# Patient Record
Sex: Male | Born: 1985 | Race: White | Hispanic: No | Marital: Married | Smoking: Former smoker
Health system: Southern US, Community
[De-identification: ages and names within clinical notes are randomized; demographics above are authoritative.]

## PROBLEM LIST (undated history)

## (undated) ENCOUNTER — Emergency Department (HOSPITAL_COMMUNITY): Admission: EM | Payer: Self-pay | Source: Home / Self Care

## (undated) DIAGNOSIS — R040 Epistaxis: Secondary | ICD-10-CM

## (undated) DIAGNOSIS — R51 Headache: Secondary | ICD-10-CM

## (undated) DIAGNOSIS — G8929 Other chronic pain: Secondary | ICD-10-CM

## (undated) DIAGNOSIS — R519 Headache, unspecified: Secondary | ICD-10-CM

## (undated) HISTORY — PX: FINGER SURGERY: SHX640

---
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
## 2006-04-14 ENCOUNTER — Emergency Department (HOSPITAL_COMMUNITY): Admission: EM | Admit: 2006-04-14 | Discharge: 2006-04-14 | Payer: Self-pay | Admitting: *Deleted

## 2006-04-17 ENCOUNTER — Emergency Department (HOSPITAL_COMMUNITY): Admission: EM | Admit: 2006-04-17 | Discharge: 2006-04-17 | Payer: Self-pay | Admitting: Emergency Medicine

## 2006-05-08 ENCOUNTER — Emergency Department (HOSPITAL_COMMUNITY): Admission: EM | Admit: 2006-05-08 | Discharge: 2006-05-09 | Payer: Self-pay | Admitting: *Deleted

## 2006-06-02 ENCOUNTER — Emergency Department (HOSPITAL_COMMUNITY): Admission: EM | Admit: 2006-06-02 | Discharge: 2006-06-02 | Payer: Self-pay | Admitting: Emergency Medicine

## 2006-12-08 ENCOUNTER — Emergency Department (HOSPITAL_COMMUNITY): Admission: EM | Admit: 2006-12-08 | Discharge: 2006-12-08 | Payer: Self-pay | Admitting: Emergency Medicine

## 2007-09-02 ENCOUNTER — Emergency Department (HOSPITAL_COMMUNITY): Admission: EM | Admit: 2007-09-02 | Discharge: 2007-09-02 | Payer: Self-pay | Admitting: Emergency Medicine

## 2012-04-13 ENCOUNTER — Emergency Department (HOSPITAL_COMMUNITY)
Admission: EM | Admit: 2012-04-13 | Discharge: 2012-04-14 | Disposition: A | Discharge status: Home / Self Care | Payer: Self-pay | Attending: Emergency Medicine | Admitting: Emergency Medicine

## 2012-04-13 ENCOUNTER — Encounter (HOSPITAL_COMMUNITY): Payer: Self-pay | Admitting: *Deleted

## 2012-04-13 DIAGNOSIS — R5381 Other malaise: Secondary | ICD-10-CM | POA: Insufficient documentation

## 2012-04-13 DIAGNOSIS — R51 Headache: Secondary | ICD-10-CM | POA: Insufficient documentation

## 2012-04-13 DIAGNOSIS — Z87891 Personal history of nicotine dependence: Secondary | ICD-10-CM | POA: Insufficient documentation

## 2012-04-13 DIAGNOSIS — R509 Fever, unspecified: Secondary | ICD-10-CM | POA: Insufficient documentation

## 2012-04-13 DIAGNOSIS — R110 Nausea: Secondary | ICD-10-CM | POA: Insufficient documentation

## 2012-04-13 HISTORY — DX: Headache: R51

## 2012-04-13 HISTORY — DX: Headache, unspecified: R51.9

## 2012-04-13 HISTORY — DX: Other chronic pain: G89.29

## 2012-04-13 LAB — CBC WITH DIFFERENTIAL/PLATELET
Basophils Absolute: 0 10*3/uL (ref 0.0–0.1)
Basophils Relative: 0 % (ref 0–1)
Eosinophils Absolute: 0.2 10*3/uL (ref 0.0–0.7)
Eosinophils Relative: 2 % (ref 0–5)
HCT: 42.6 % (ref 39.0–52.0)
Hemoglobin: 14.6 g/dL (ref 13.0–17.0)
MCH: 27.5 pg (ref 26.0–34.0)
MCHC: 34.3 g/dL (ref 30.0–36.0)
Monocytes Absolute: 1.1 10*3/uL — ABNORMAL HIGH (ref 0.1–1.0)
Monocytes Relative: 12 % (ref 3–12)
RDW: 13.2 % (ref 11.5–15.5)

## 2012-04-13 MED ORDER — ACETAMINOPHEN 325 MG PO TABS
1000.0000 mg | ORAL_TABLET | Freq: Once | ORAL | Status: AC
  Administered 2012-04-13: 975 mg via ORAL
  Filled 2012-04-13: qty 3

## 2012-04-13 MED ORDER — METOCLOPRAMIDE HCL 5 MG/ML IJ SOLN
10.0000 mg | Freq: Once | INTRAMUSCULAR | Status: AC
  Administered 2012-04-13: 10 mg via INTRAMUSCULAR
  Filled 2012-04-13: qty 2

## 2012-04-13 NOTE — ED Provider Notes (Signed)
History     CSN: [id number]  Arrival date & time 04/13/12  2019   None     Chief Complaint  Patient presents with  . Fever  . Headache    (Consider location/radiation/quality/duration/timing/severity/associated sxs/prior treatment) HPI History provided by pt.   Pt has had the "flu" and "migraine" for the past 4 days.  Denies nasal congestion, rhinorrhea, cough and sore throat.  Has had a fever for the past 2 days, max temp 102, as well as right frontal headache w/ associated blurred vision and photophobia.  Has had generalized weakness and body aches.  Has also had difficulty completely emptying bladder when urinating, but otherwise no GU sx.  Has had nausea and 2 episodes of vomiting, but denies abd pain and diarrhea.  No rash and no known tick exposure. No prior h/o migraines.  Denies head trauma.    Past Medical History  Diagnosis Date  . Chronic headaches     History reviewed. No pertinent past surgical history.  No family history on file.  History  Substance Use Topics  . Smoking status: Former Games developer  . Smokeless tobacco: Not on file  . Alcohol Use: Yes     occasional      Review of Systems  All other systems reviewed and are negative.    Allergies  Review of patient's allergies indicates no known allergies.  Home Medications   Current Outpatient Rx  Name Route Sig Dispense Refill  . IBUPROFEN 200 MG PO TABS Oral Take 200 mg by mouth every 6 (six) hours as needed. For pain    . NAPROXEN SODIUM 220 MG PO TABS Oral Take 220 mg by mouth 2 (two) times daily after a meal. For pain      BP 134/69  Pulse 97  Temp 100.7 F (38.2 C) (Oral)  Resp 18  SpO2 100%  Physical Exam  Nursing note and vitals reviewed. Constitutional: He is oriented to person, place, and time. He appears well-developed and well-nourished. No distress.  HENT:  Head: Normocephalic and atraumatic.  Mouth/Throat: Oropharynx is clear and moist. No oropharyngeal exudate.       No sinus  tenderness.    Eyes:       Normal appearance  Neck: Normal range of motion. Neck supple.       No meningeal signs  Cardiovascular: Normal rate, regular rhythm and intact distal pulses.   Pulmonary/Chest: Effort normal and breath sounds normal.  Abdominal: Soft. Bowel sounds are normal. He exhibits no distension. There is no tenderness.  Genitourinary:       No CVA ttp  Musculoskeletal: Normal range of motion.  Lymphadenopathy:    He has no cervical adenopathy.  Neurological: He is alert and oriented to person, place, and time. No sensory deficit. Coordination normal.       CN 3-12 intact.  No nystagmus. 5/5 and equal upper and lower extremity strength.  No past pointing.     Skin: Skin is warm and dry. No rash noted.  Psychiatric: He has a normal mood and affect. His behavior is normal.    ED Course  Procedures (including critical care time)  Labs Reviewed  CBC WITH DIFFERENTIAL - Abnormal; Notable for the following:    Monocytes Absolute 1.1 (*)     All other components within normal limits  URINALYSIS, ROUTINE W REFLEX MICROSCOPIC   No results found.   1. Fever       MDM  Healthy 25yo M presents w/ fever, generalized weakness,  body aches, right frontal headache and N/V.  He is febrile on exam but well-appearing and no focal neuro deficits or meningeal signs.  Doubt meningitis.  Low suspicion for strep pharyngitis based on centor criteria.  Low suspicion for pneumonia based on vital signs and lack of pulmonary sx.  Has not had abdominal pain or diarrhea to suggest gastroenteritis.  U/A neg for UTI.  No known tick exposure or objective findings concerning for RMSF.  Fever resolved w/ IM reglan and fever improved w/ acetaminophen.  Pt d/c'd home w/ phenergan to treat nausea and hopefully headache.  Strict return precautions discussed.        Otilio Miu, [place] 04/14/12 [phone]

## 2012-04-13 NOTE — ED Notes (Signed)
Pt in c/o fever, generalized body aches, and headache x3 days, states he has noted decreased appetite.

## 2012-04-13 NOTE — ED Notes (Signed)
Pt to ED c/o fever, nausea, migraines and blurred vision associated with migraine since Mon.  States has been taking ibuprofen/tylenol with little relief.

## 2012-04-13 NOTE — ED Notes (Signed)
i-Stat CHEM8+ Results:  Na 138 K 3.6 Cl 102 iCa 1.12 TCO2 24  Glu 157 BUN 14 Crea 1.1 Hct 46 Hb* 15.6  AnGap 17

## 2012-04-14 LAB — URINALYSIS, ROUTINE W REFLEX MICROSCOPIC
Bilirubin Urine: NEGATIVE
Hgb urine dipstick: NEGATIVE
Ketones, ur: NEGATIVE mg/dL
Nitrite: NEGATIVE
Protein, ur: NEGATIVE mg/dL
Urobilinogen, UA: 1 mg/dL (ref 0.0–1.0)

## 2012-04-14 LAB — POCT I-STAT, CHEM 8
Calcium, Ion: 1.12 mmol/L (ref 1.12–1.23)
HCT: 46 % (ref 39.0–52.0)
Hemoglobin: 15.6 g/dL (ref 13.0–17.0)
Sodium: 138 mEq/L (ref 135–145)
TCO2: 24 mmol/L (ref 0–100)

## 2012-04-14 MED ORDER — PROMETHAZINE HCL 25 MG PO TABS: 25.0000 mg | ORAL_TABLET | Freq: Four times a day (QID) | ORAL | Status: DC | PRN

## 2012-05-05 NOTE — ED Provider Notes (Signed)
Medical screening examination/treatment/procedure(s) were performed by non-physician practitioner and as supervising physician I was immediately available for consultation/collaboration.   Loren Racer, MD 05/05/12 1616

## 2012-05-09 NOTE — ED Provider Notes (Signed)
Medical screening examination/treatment/procedure(s) were performed by non-physician practitioner and as supervising physician I was immediately available for consultation/collaboration.  Jaidyn Kuhl Lytle Michaels, MD 05/09/12 [phone]

## 2012-10-04 ENCOUNTER — Encounter (HOSPITAL_COMMUNITY): Payer: Self-pay

## 2012-10-04 ENCOUNTER — Emergency Department (HOSPITAL_COMMUNITY)
Admission: EM | Admit: 2012-10-04 | Discharge: 2012-10-04 | Disposition: A | Discharge status: Home / Self Care | Payer: Self-pay | Attending: Emergency Medicine | Admitting: Emergency Medicine

## 2012-10-04 ENCOUNTER — Emergency Department (HOSPITAL_COMMUNITY): Payer: Self-pay

## 2012-10-04 DIAGNOSIS — Z8679 Personal history of other diseases of the circulatory system: Secondary | ICD-10-CM | POA: Insufficient documentation

## 2012-10-04 DIAGNOSIS — K5289 Other specified noninfective gastroenteritis and colitis: Secondary | ICD-10-CM | POA: Insufficient documentation

## 2012-10-04 DIAGNOSIS — Z87891 Personal history of nicotine dependence: Secondary | ICD-10-CM | POA: Insufficient documentation

## 2012-10-04 DIAGNOSIS — R1084 Generalized abdominal pain: Secondary | ICD-10-CM | POA: Insufficient documentation

## 2012-10-04 DIAGNOSIS — K529 Noninfective gastroenteritis and colitis, unspecified: Secondary | ICD-10-CM

## 2012-10-04 DIAGNOSIS — R197 Diarrhea, unspecified: Secondary | ICD-10-CM | POA: Insufficient documentation

## 2012-10-04 LAB — CBC WITH DIFFERENTIAL/PLATELET
Basophils Absolute: 0 10*3/uL (ref 0.0–0.1)
Lymphocytes Relative: 32 % (ref 12–46)
Lymphs Abs: 1.9 10*3/uL (ref 0.7–4.0)
Neutrophils Relative %: 50 % (ref 43–77)
Platelets: 204 10*3/uL (ref 150–400)
RBC: 5.73 MIL/uL (ref 4.22–5.81)
RDW: 13.6 % (ref 11.5–15.5)
WBC: 6.1 10*3/uL (ref 4.0–10.5)

## 2012-10-04 LAB — COMPREHENSIVE METABOLIC PANEL
ALT: 86 U/L — ABNORMAL HIGH (ref 0–53)
AST: 61 U/L — ABNORMAL HIGH (ref 0–37)
Alkaline Phosphatase: 117 U/L (ref 39–117)
CO2: 28 mEq/L (ref 19–32)
Calcium: 9.3 mg/dL (ref 8.4–10.5)
GFR calc Af Amer: >90 mL/min (ref >90)
GFR calc non Af Amer: >90 mL/min (ref >90)
Glucose, Bld: 80 mg/dL (ref 70–99)
Potassium: 3.7 mEq/L (ref 3.5–5.1)
Sodium: 138 mEq/L (ref 135–145)
Total Protein: 7.7 g/dL (ref 6.0–8.3)

## 2012-10-04 MED ORDER — ONDANSETRON 4 MG PO TBDP
ORAL_TABLET | ORAL | Status: AC
  Filled 2012-10-04: qty 2

## 2012-10-04 MED ORDER — DIPHENOXYLATE-ATROPINE 2.5-0.025 MG PO TABS: 1.0000 | ORAL_TABLET | Freq: Four times a day (QID) | ORAL | Status: DC | PRN

## 2012-10-04 MED ORDER — PROMETHAZINE HCL 25 MG PO TABS: 25.0000 mg | ORAL_TABLET | Freq: Four times a day (QID) | ORAL | Status: DC | PRN

## 2012-10-04 MED ORDER — DIPHENOXYLATE-ATROPINE 2.5-0.025 MG PO TABS
2.0000 | ORAL_TABLET | Freq: Once | ORAL | Status: AC
  Administered 2012-10-04: 2 via ORAL
  Filled 2012-10-04: qty 2

## 2012-10-04 MED ORDER — ONDANSETRON 4 MG PO TBDP
8.0000 mg | ORAL_TABLET | Freq: Once | ORAL | Status: AC
  Administered 2012-10-04: 8 mg via ORAL

## 2012-10-04 MED ORDER — ONDANSETRON HCL 4 MG/2ML IJ SOLN: 4.0000 mg | Freq: Once | INTRAMUSCULAR | Status: DC

## 2012-10-04 MED ORDER — SODIUM CHLORIDE 0.9 % IV BOLUS (SEPSIS)
1000.0000 mL | Freq: Once | INTRAVENOUS | Status: AC
  Administered 2012-10-04: 1000 mL via INTRAVENOUS

## 2012-10-04 NOTE — ED Notes (Signed)
Pt ate cheeseburger and became very ill Sunday evening and believes it is from the cheese.  Pt. Having n/v/d

## 2012-10-04 NOTE — ED Provider Notes (Signed)
History     CSN: [id number]  Arrival date & time 10/04/12  1347   First MD Initiated Contact with Patient 10/04/12 1616      Chief Complaint  Patient presents with  . Emesis    (Consider location/radiation/quality/duration/timing/severity/associated sxs/prior treatment) HPI Comments: I wish to have nausea, vomiting and diarrhea. Patient reports that symptoms have been going on for 3 days. He thinks that it is something that he ate three days ago. He has been having mild abdominal cramping, no fever. Patient had vomiting for 2 days, today has not had any vomiting but continues to have yellow, watery diarrhea.  Patient is a 27 y.o. male presenting with vomiting.  Emesis Associated symptoms: abdominal pain and diarrhea     Past Medical History  Diagnosis Date  . Chronic headaches     History reviewed. No pertinent past surgical history.  History reviewed. No pertinent family history.  History  Substance Use Topics  . Smoking status: Former Games developer  . Smokeless tobacco: Not on file  . Alcohol Use: Yes     Comment: occasional      Review of Systems  Constitutional: Negative for fever.  Gastrointestinal: Positive for vomiting, abdominal pain and diarrhea.  All other systems reviewed and are negative.    Allergies  Review of patient's allergies indicates no known allergies.  Home Medications   Current Outpatient Rx  Name  Route  Sig  Dispense  Refill  . ibuprofen (ADVIL,MOTRIN) 200 MG tablet   Oral   Take 200 mg by mouth every 6 (six) hours as needed. For pain         . naproxen sodium (ANAPROX) 220 MG tablet   Oral   Take 220 mg by mouth 2 (two) times daily after a meal. For pain         . promethazine (PHENERGAN) 25 MG tablet   Oral   Take 1 tablet (25 mg total) by mouth every 6 (six) hours as needed for nausea.   20 tablet   0     BP 128/59  Pulse 70  Temp(Src) 98.4 F (36.9 C) (Oral)  Resp 16  SpO2 100%  Physical Exam  Constitutional: He  is oriented to person, place, and time. He appears well-developed and well-nourished. No distress.  HENT:  Head: Normocephalic and atraumatic.  Right Ear: Hearing normal.  Nose: Nose normal.  Mouth/Throat: Oropharynx is clear and moist and mucous membranes are normal.  Eyes: Conjunctivae and EOM are normal. Pupils are equal, round, and reactive to light.  Neck: Normal range of motion. Neck supple.  Cardiovascular: Normal rate, regular rhythm, S1 normal and S2 normal.  Exam reveals no gallop and no friction rub.   No murmur heard. Pulmonary/Chest: Effort normal and breath sounds normal. No respiratory distress. He exhibits no tenderness.  Abdominal: Soft. Normal appearance and bowel sounds are normal. There is no hepatosplenomegaly. There is generalized tenderness. There is no rebound, no guarding, no tenderness at McBurney's point and negative Murphy's sign. No hernia.  Musculoskeletal: Normal range of motion.  Neurological: He is alert and oriented to person, place, and time. He has normal strength. No cranial nerve deficit or sensory deficit. Coordination normal. GCS eye subscore is 4. GCS verbal subscore is 5. GCS motor subscore is 6.  Skin: Skin is warm, dry and intact. No rash noted. No cyanosis.  Psychiatric: He has a normal mood and affect. His speech is normal and behavior is normal. Thought content normal.    ED  Course  Procedures (including critical care time)  Labs Reviewed  CBC WITH DIFFERENTIAL - Abnormal; Notable for the following:    Eosinophils Relative 8 (*)    All other components within normal limits  COMPREHENSIVE METABOLIC PANEL - Abnormal; Notable for the following:    AST 61 (*)    ALT 86 (*)    All other components within normal limits  LIPASE, BLOOD   US Abdomen Complete  10/04/2012  *RADIOLOGY REPORT*  Clinical Data:  Upper abdominal pain.  Nausea and vomiting. Elevated liver function tests.  COMPLETE ABDOMINAL ULTRASOUND  Comparison:  None.  Findings:   Gallbladder:  No shadowing gallstones or echogenic sludge.  No gallbladder wall thickening or pericholecystic fluid.  Negative sonographic Murphy's sign according to the ultrasound technologist.  Common bile duct:  Upper normal in caliber measuring 6-8 mm.  No visible bile duct stones.  Liver:  Normal size and echotexture without focal parenchymal abnormality.  Patent portal vein with hepatopetal flow.  IVC:  Patent.  Pancreas:  Normal size and echotexture without focal parenchymal abnormality.  Spleen:  Normal size and echotexture without focal parenchymal abnormality.  Note made of an accessory splenule at the hilum measuring approximately 1.3 cm.  Right Kidney:  No hydronephrosis.  Well-preserved cortex.  No shadowing calculi.  Normal size and parenchymal echotexture without focal abnormalities.  Approximately 10.1 cm in length.  Left Kidney:  No hydronephrosis.  Well-preserved cortex.  No shadowing calculi.  Normal size and parenchymal echotexture without focal abnormalities.  Approximately 10.1 cm in length.  Abdominal aorta:  Normal in caliber throughout its visualized course in the abdomen without significant atherosclerosis.  IMPRESSION:  Normal examination.   Original Report Authenticated By: Hulan Saas, M.D.      Diagnoses: Vomiting, dehydration    MDM  This presents to the ER for evaluation of nausea and vomiting, as well as diarrhea. He reports that he has not been elevating down for several days. He has normal vital signs. She has mild and diffuse abdominal tenderness, mostly upper abdomen. No tenderness below the umbilicus. Lab work reveals a slightly elevated transaminases. Because of this, ultrasound was performed to rule out gallbladder disease. It was normal. Patient discharged after a fluid bolus. He is feeling better.        Gilda Crease, MD 10/04/12 2122

## 2012-10-04 NOTE — ED Notes (Signed)
Per pt sts feeling better after medication. Given PO fluids and tolerating well. Denies Nausea

## 2013-08-29 ENCOUNTER — Emergency Department (HOSPITAL_COMMUNITY)
Admission: EM | Admit: 2013-08-29 | Discharge: 2013-08-29 | Disposition: A | Discharge status: Home / Self Care | Payer: BC Managed Care – PPO | Attending: Emergency Medicine | Admitting: Emergency Medicine

## 2013-08-29 ENCOUNTER — Encounter (HOSPITAL_COMMUNITY): Payer: Self-pay | Admitting: Emergency Medicine

## 2013-08-29 DIAGNOSIS — Z87891 Personal history of nicotine dependence: Secondary | ICD-10-CM | POA: Insufficient documentation

## 2013-08-29 DIAGNOSIS — G8929 Other chronic pain: Secondary | ICD-10-CM | POA: Insufficient documentation

## 2013-08-29 DIAGNOSIS — R040 Epistaxis: Secondary | ICD-10-CM | POA: Insufficient documentation

## 2013-08-29 HISTORY — DX: Epistaxis: R04.0

## 2013-08-29 MED ORDER — SILVER NITRATE-POT NITRATE 75-25 % EX MISC
1.0000 | Freq: Once | CUTANEOUS | Status: DC
  Filled 2013-08-29: qty 1

## 2013-08-29 MED ORDER — OXYMETAZOLINE HCL 0.05 % NA SOLN
1.0000 | Freq: Once | NASAL | Status: DC
  Filled 2013-08-29: qty 15

## 2013-08-29 NOTE — ED Notes (Signed)
Small amount of blood noted in R nare, ice pack provided

## 2013-08-29 NOTE — ED Provider Notes (Addendum)
CSN: [id number]     Arrival date & time 08/29/13  1840 History   First MD Initiated Contact with Patient 08/29/13 2039     Chief Complaint  Patient presents with  . Epistaxis     (Consider location/radiation/quality/duration/timing/severity/associated sxs/prior Treatment) HPI 28 year old male who presents today with several episodes of epistaxis during the day today. These were  controlled with pressure to his nose. He denies any trauma to his nose but he has had an upper respiratory infection with sinus congestion. He has not had any fever or chills. Has not taken any blood thinners or antiplatelets. Not previously had problems with nosebleeds in the past. He is not lightheaded or weak. Past Medical History  Diagnosis Date  . Chronic headaches   . Epistaxis    History reviewed. No pertinent past surgical history. No family history on file. History  Substance Use Topics  . Smoking status: Former Games developermoker  . Smokeless tobacco: Not on file  . Alcohol Use: Yes     Comment: occasional    Review of Systems  All other systems reviewed and are negative.      Allergies  Review of patient's allergies indicates no known allergies.  Home Medications   Current Outpatient Rx  Name  Route  Sig  Dispense  Refill  . Phenyleph-CPM-DM-APAP (ALKA-SELTZER PLUS COLD & COUGH) 11-17-08-325 MG CAPS   Oral   Take 1 tablet by mouth as needed (cold).          BP 137/79  Pulse 68  Temp(Src) 98.4 F (36.9 C) (Oral)  Resp 18  Ht 5\' 7"  (1.702 m)  Wt 165 lb (74.844 kg)  BMI 25.84 kg/m2  SpO2 98% Physical Exam  Nursing note and vitals reviewed. Constitutional: He is oriented to person, place, and time. He appears well-developed and well-nourished.  HENT:  Head: Normocephalic and atraumatic.  Nose: Mucosal edema and rhinorrhea present.   Dried blood at the external right nares and clots on nasal septum. No bleeding noted in left nares. No bleeding noted  in oropharynx.  Eyes: Conjunctivae  are normal. Pupils are equal, round, and reactive to light.  Neck: Normal range of motion. Neck supple.  Cardiovascular: Normal rate and regular rhythm.   Pulmonary/Chest: Effort normal.  Abdominal: Soft. Bowel sounds are normal.  Musculoskeletal: Normal range of motion.  Neurological: He is alert and oriented to person, place, and time. He has normal reflexes.  Skin: Skin is warm and dry.  Psychiatric: He has a normal mood and affect. His behavior is normal. Thought content normal.    ED Course  EPISTAXIS MANAGEMENT Date/Time: 08/29/2013 9:11 PM Performed by: Hilario QuarryAY, Annalea Alguire S Authorized by: Hilario QuarryAY, Jennalee Greaves S Consent: Verbal consent obtained. Consent given by: patient Patient identity confirmed: verbally with patient Local anesthetic: lidocaine 1% without epinephrine Anesthetic total: 1 ( patieenntt  bblleeww  tthhaat is free of clots. Afrin was used and then lidocaine was applied on cotton ball.) ml Treatment site: right anterior Repair method: silver nitrate Post-procedure assessment: bleeding stopped Treatment complexity: simple   (including critical care time) Labs Review Labs Reviewed - No data to display Imaging Review No results found.   MDM   Final diagnoses:  None   Patient with anterior right nose bleed controlled with cautery with silver nitrate. I have given the patient treatment precautions and nosebleed instructions and he voices understanding.   Hilario Quarryanielle S Gustavus Haskin, MD 08/29/13 2113  Hilario Quarryanielle S Anivea Velasques, MD 08/29/13 2115

## 2013-08-29 NOTE — ED Notes (Signed)
Initial contact - pt resting on stretcher with visitor at bs.  Pt reports R sided epistaxis x3 hrs.  On arrival to treatment room, no bleeding noted.  Pt reports "filled up the toilet and the sink at home".  Pt denies dizziness, nausea or other complaints.  Skin PWD.  MAEI, amb with steady gait.  Speaking full/clear sentences.  NAD.  Awaiting EDP eval.

## 2013-08-29 NOTE — Discharge Instructions (Signed)
Nosebleed  A nosebleed can be caused by many things, including:   Getting hit hard in the nose.   Infections.   Dry nose.   Colds.   Medicines.  Your doctor may do lab testing if you get nosebleeds a lot and the cause is not known.  HOME CARE    If your nose was packed with material, keep it there until your doctor takes it out. Put the pack back in your nose if the pack falls out.   Do not blow your nose for 12 hours after the nosebleed.   Sit up and bend forward if your nose starts bleeding again. Pinch the front half of your nose nonstop for 20 minutes.   Put petroleum jelly inside your nose every morning if you have a dry nose.   Use a humidifier to make the air less dry.   Do not take aspirin.   Try not to strain, lift, or bend at the waist for many days after the nosebleed.  GET HELP RIGHT AWAY IF:    Nosebleeds keep happening and are hard to stop or control.   You have bleeding or bruises that are not normal on other parts of the body.   You have a fever.   The nosebleeds get worse.   You get lightheaded, feel faint, sweaty, or throw up (vomit) blood.  MAKE SURE YOU:    Understand these instructions.   Will watch your condition.   Will get help right away if you are not doing well or get worse.  Document Released: 04/13/2008 Document Revised: 09/27/2011 Document Reviewed: 04/13/2008  [employer] Patient Information 2014 [employer].

## 2013-08-29 NOTE — ED Notes (Signed)
Pt c/o R sided nose bleed x 2 hours. Per girlfriend pt has had 4 episodes today. Pt has taken alka seltzer for cold in last 2 days.

## 2014-03-21 ENCOUNTER — Encounter (HOSPITAL_COMMUNITY): Payer: Self-pay | Admitting: Emergency Medicine

## 2014-03-21 ENCOUNTER — Emergency Department (HOSPITAL_COMMUNITY)
Admission: EM | Admit: 2014-03-21 | Discharge: 2014-03-21 | Disposition: A | Discharge status: Home / Self Care | Payer: BC Managed Care – PPO | Attending: Emergency Medicine | Admitting: Emergency Medicine

## 2014-03-21 DIAGNOSIS — Z23 Encounter for immunization: Secondary | ICD-10-CM | POA: Insufficient documentation

## 2014-03-21 DIAGNOSIS — S61011A Laceration without foreign body of right thumb without damage to nail, initial encounter: Secondary | ICD-10-CM

## 2014-03-21 DIAGNOSIS — Z87891 Personal history of nicotine dependence: Secondary | ICD-10-CM | POA: Insufficient documentation

## 2014-03-21 DIAGNOSIS — S61209A Unspecified open wound of unspecified finger without damage to nail, initial encounter: Secondary | ICD-10-CM | POA: Insufficient documentation

## 2014-03-21 DIAGNOSIS — Y9389 Activity, other specified: Secondary | ICD-10-CM | POA: Diagnosis not present

## 2014-03-21 DIAGNOSIS — W268XXA Contact with other sharp object(s), not elsewhere classified, initial encounter: Secondary | ICD-10-CM | POA: Diagnosis not present

## 2014-03-21 DIAGNOSIS — Y9289 Other specified places as the place of occurrence of the external cause: Secondary | ICD-10-CM | POA: Insufficient documentation

## 2014-03-21 DIAGNOSIS — S61210A Laceration without foreign body of right index finger without damage to nail, initial encounter: Secondary | ICD-10-CM

## 2014-03-21 MED ORDER — TETANUS-DIPHTH-ACELL PERTUSSIS 5-2.5-18.5 LF-MCG/0.5 IM SUSP
0.5000 mL | Freq: Once | INTRAMUSCULAR | Status: AC
  Administered 2014-03-21: 0.5 mL via INTRAMUSCULAR
  Filled 2014-03-21: qty 0.5

## 2014-03-21 MED ORDER — HYDROCODONE-ACETAMINOPHEN 5-325 MG PO TABS
1.0000 | ORAL_TABLET | Freq: Once | ORAL | Status: AC
  Administered 2014-03-21: 1 via ORAL
  Filled 2014-03-21: qty 1

## 2014-03-21 MED ORDER — ONDANSETRON 4 MG PO TBDP
4.0000 mg | ORAL_TABLET | Freq: Once | ORAL | Status: AC
  Administered 2014-03-21: 4 mg via ORAL
  Filled 2014-03-21: qty 1

## 2014-03-21 NOTE — ED Notes (Signed)
Declined W/C at D/C and was escorted to lobby by RN. 

## 2014-03-21 NOTE — ED Notes (Signed)
Presents with 1 inch laceration to right index finger from a metal part of a car. CMS intact, bleeding controlled, unknown last tetanus

## 2014-03-21 NOTE — ED Provider Notes (Signed)
CSN: [id number]     Arrival date & time 03/21/14  1511 History  This chart was scribed for a non-physician practitioner, Emilia Beck, PA-C working with Glynn Octave, MD by [place] Peace, ED Scribe. The patient was seen in [facility] The patient's care was started at 4:27 PM.    Chief Complaint  Patient presents with  . Laceration     Patient is a 28 y.o. male presenting with skin laceration. The history is provided by the patient. No language interpreter was used.  Laceration Location:  Hand Hand laceration location:  R finger Depth:  Cutaneous Bleeding: controlled   Laceration mechanism:  Metal edge Tetanus status:  Unknown HPI Comments: Jared Lane is a 28 y.o. male who presents to the Emergency Department complaining of laceration to the lateral aspect of his right right index finger and laceration to his right thumb that occurred while pt was working on his car and sliced his hand on a tool that he was holding. Pt's bleeding is controlled, and states to have feeling in his finger. Pt had great amount of grease on his right hand where lacerations occurred PTA. He reports that his last tetanus shot is unknown.   Past Medical History  Diagnosis Date  . Chronic headaches   . Epistaxis    History reviewed. No pertinent past surgical history. History reviewed. No pertinent family history. History  Substance Use Topics  . Smoking status: Former Games developer  . Smokeless tobacco: Not on file  . Alcohol Use: Yes     Comment: occasional    Review of Systems  Constitutional: Negative for fever and chills.  Gastrointestinal: Negative for nausea, vomiting and diarrhea.  Skin: Positive for wound.  All other systems reviewed and are negative.     Allergies  Review of patient's allergies indicates no known allergies.  Home Medications   Prior to Admission medications   Medication Sig Start Date End Date Taking? Authorizing Provider  Phenyleph-CPM-DM-APAP (ALKA-SELTZER  PLUS COLD & COUGH) 11-17-08-325 MG CAPS Take 1 tablet by mouth as needed (cold).    Historical Provider, MD   BP 118/63  Pulse 62  Temp(Src) 98.4 F (36.9 C) (Oral)  Resp 18  Wt 170 lb (77.111 kg)  SpO2 100% Physical Exam  Nursing note and vitals reviewed. Constitutional: He is oriented to person, place, and time. He appears well-developed and well-nourished. No distress.  HENT:  Head: Normocephalic and atraumatic.  Eyes: Conjunctivae and EOM are normal.  Neck: Neck supple. No tracheal deviation present.  Cardiovascular: Normal rate.   Pulmonary/Chest: Effort normal. No respiratory distress.  Musculoskeletal: Normal range of motion.  Full active ROM of right index finger and right thumb. No obvious deformities. Flexor and extensor tendons intact.   Neurological: He is alert and oriented to person, place, and time.  Sensation intact of right index finger and right thumb.   Skin: Skin is warm and dry.  2 cm laceration to lateral aspect of right index finger over PIP with bleeding controlled.  2 cm laceration over dorsal aspect of the base of right thumb with bleeding controlled.   Psychiatric: He has a normal mood and affect. His behavior is normal.    ED Course  Procedures (including critical care time)\  LACERATION REPAIR Performed by: Emilia Beck Authorized by: Emilia Beck Consent: Verbal consent obtained. Risks and benefits: risks, benefits and alternatives were discussed Consent given by: patient Patient identity confirmed: provided demographic data Prepped and Draped in normal sterile fashion Wound explored  Laceration Location: right index finger-lateral PIP area   Laceration Length: 2 cm  No Foreign Bodies seen or palpated  Anesthesia: local infiltration  Local anesthetic: lidocaine 2% without epinephrine  Anesthetic total: 2 ml  Irrigation method: syringe Amount of cleaning: standard  Skin closure: 4-0 prolene  Number of sutures:  4  Technique: simple  Patient tolerance: Patient tolerated the procedure well with no immediate complications.  LACERATION REPAIR Performed by: Emilia Beck Authorized by: Emilia Beck Consent: Verbal consent obtained. Risks and benefits: risks, benefits and alternatives were discussed Consent given by: patient Patient identity confirmed: provided demographic data Prepped and Draped in normal sterile fashion Wound explored  Laceration Location: dorsal right thumb base  Laceration Length: 2 cm  No Foreign Bodies seen or palpated  Anesthesia: local infiltration  Local anesthetic: lidocaine 2% without epinephrine  Anesthetic total: 2 ml  Irrigation method: syringe Amount of cleaning: standard  Skin closure: 4-0 prolene  Number of sutures: 3  Technique: simple  Patient tolerance: Patient tolerated the procedure well with no immediate complications.   Labs Review Labs Reviewed - No data to display  Results for orders placed during the hospital encounter of 10/04/12  CBC WITH DIFFERENTIAL      Result Value Ref Range   WBC 6.1  4.0 - 10.5 K/uL   RBC 5.73  4.22 - 5.81 MIL/uL   Hemoglobin 15.6  13.0 - 17.0 g/dL   HCT 16.1  09.6 - 04.5 %   MCV 80.3  78.0 - 100.0 fL   MCH 27.2  26.0 - 34.0 pg   MCHC 33.9  30.0 - 36.0 g/dL   RDW 40.9  81.1 - 91.4 %   Platelets 204  150 - 400 K/uL   Neutrophils Relative % 50  43 - 77 %   Neutro Abs 3.0  1.7 - 7.7 K/uL   Lymphocytes Relative 32  12 - 46 %   Lymphs Abs 1.9  0.7 - 4.0 K/uL   Monocytes Relative 11  3 - 12 %   Monocytes Absolute 0.6  0.1 - 1.0 K/uL   Eosinophils Relative 8 (*) 0 - 5 %   Eosinophils Absolute 0.5  0.0 - 0.7 K/uL   Basophils Relative 1  0 - 1 %   Basophils Absolute 0.0  0.0 - 0.1 K/uL  COMPREHENSIVE METABOLIC PANEL      Result Value Ref Range   Sodium 138  135 - 145 mEq/L   Potassium 3.7  3.5 - 5.1 mEq/L   Chloride 100  96 - 112 mEq/L   CO2 28  19 - 32 mEq/L   Glucose, Bld 80  70 - 99 mg/dL    BUN 18  6 - 23 mg/dL   Creatinine, Ser 7.82  0.50 - 1.35 mg/dL   Calcium 9.3  8.4 - 95.6 mg/dL   Total Protein 7.7  6.0 - 8.3 g/dL   Albumin 4.0  3.5 - 5.2 g/dL   AST 61 (*) 0 - 37 U/L   ALT 86 (*) 0 - 53 U/L   Alkaline Phosphatase 117  39 - 117 U/L   Total Bilirubin 0.6  0.3 - 1.2 mg/dL   GFR calc non Af Amer >90  >90 mL/min   GFR calc Af Amer >90  >90 mL/min  LIPASE, BLOOD      Result Value Ref Range   Lipase 28  11 - 59 U/L   No results found.    Imaging Review No results found.  EKG Interpretation None     Medications  Tdap (BOOSTRIX) injection 0.5 mL (0.5 mLs Intramuscular Given 03/21/14 1533)  HYDROcodone-acetaminophen (NORCO/VICODIN) 5-325 MG per tablet 1 tablet (1 tablet Oral Given 03/21/14 1550)  ondansetron (ZOFRAN-ODT) disintegrating tablet 4 mg (4 mg Oral Given 03/21/14 1550)   4:31 PM- Treatment plan was discussed with patient who verbalizes understanding and agrees which includes sutures and pain medication.   MDM   Final diagnoses:  Laceration of right index finger w/o foreign body w/o damage to nail, initial encounter  Laceration of right thumb without foreign body without damage to nail, initial encounter    6:21 PM Patient's lacerations repaired without difficulty. Patient given tdap here. Patient instructed to keep wounds clean and return in 10 days for suture removal. No other injuries.   I personally performed the services described in this documentation, which was scribed in my presence. The recorded information has been reviewed and is accurate.    Emilia Beck, [place] 03/21/14 [phone]

## 2014-03-21 NOTE — ED Provider Notes (Signed)
Medical screening examination/treatment/procedure(s) were performed by non-physician practitioner and as supervising physician I was immediately available for consultation/collaboration.   EKG Interpretation None        Glynn Octave, MD 03/21/14 2328

## 2014-03-21 NOTE — Discharge Instructions (Signed)
Take ibuprofen or tylenol as needed for pain. Keep wound area clean. Return to the ED in 10 days for suture removal.

## 2015-12-21 ENCOUNTER — Emergency Department (HOSPITAL_COMMUNITY)
Admission: EM | Admit: 2015-12-21 | Discharge: 2015-12-21 | Disposition: A | Discharge status: Home / Self Care | Payer: Self-pay | Attending: Emergency Medicine | Admitting: Emergency Medicine

## 2015-12-21 DIAGNOSIS — L255 Unspecified contact dermatitis due to plants, except food: Secondary | ICD-10-CM | POA: Insufficient documentation

## 2015-12-21 DIAGNOSIS — L237 Allergic contact dermatitis due to plants, except food: Secondary | ICD-10-CM

## 2015-12-21 DIAGNOSIS — Z87891 Personal history of nicotine dependence: Secondary | ICD-10-CM | POA: Insufficient documentation

## 2015-12-21 MED ORDER — HYDROCORTISONE 2.5 % EX LOTN: TOPICAL_LOTION | Freq: Two times a day (BID) | CUTANEOUS | Status: DC

## 2015-12-21 MED ORDER — PREDNISONE 20 MG PO TABS: 60.0000 mg | ORAL_TABLET | Freq: Every day | ORAL | Status: DC

## 2015-12-21 MED ORDER — PERMETHRIN 5 % EX CREA
TOPICAL_CREAM | Freq: Once | CUTANEOUS | Status: DC
  Filled 2015-12-21: qty 60

## 2015-12-21 NOTE — ED Notes (Addendum)
Pt c/o 1 week history of rash to body, onset after he was exposed to poison ivy. He has been applying OTC creams with no relief. He states the rash is painful and itchy

## 2015-12-21 NOTE — ED Provider Notes (Signed)
CSN: [id number]     Arrival date & time 12/21/15  1618 History  By signing my name below, I, Jared Lane, attest that this documentation has been prepared under the direction and in the presence of Sealed Air CorporationHeather Xaria Judon, PA-C. Electronically Signed: Phillis HaggisGabriella Lane, ED Scribe. 12/21/2015. 4:54 PM.   Chief Complaint  Patient presents with  . Skin Problem   The history is provided by the patient. No language interpreter was used.  HPI Comments: Jared Lane is a 30 y.o. male who presents to the Emergency Department complaining of gradually worsening itching rash onset one week ago. Pt reports that the rash began after possibly being exposed to poison ivy after climbing in a tree. He states that the rash began in between his fingers and has spread up to his arms and torso. He has been using "Ivy-Off" anti-itch cream to no relief. He denies fever, chills, SOB, tongue swelling, nausea, or vomiting.    Past Medical History  Diagnosis Date  . Chronic headaches   . Epistaxis    No past surgical history on file. No family history on file. Social History  Substance Use Topics  . Smoking status: Former Games developermoker  . Smokeless tobacco: Not on file  . Alcohol Use: Yes     Comment: occasional    Review of Systems A complete 10 system review of systems was obtained and all systems are negative except as noted in the HPI and PMH.   Allergies  Review of patient's allergies indicates no known allergies.  Home Medications   Prior to Admission medications   Medication Sig Start Date End Date Taking? Authorizing Provider  Phenyleph-CPM-DM-APAP (ALKA-SELTZER PLUS COLD & COUGH) 11-17-08-325 MG CAPS Take 1 tablet by mouth as needed (cold).    Historical Provider, MD   BP 142/68 mmHg  Pulse 82  Temp(Src) 98.3 F (36.8 C) (Oral)  Resp 16  Ht 5\' 8"  (1.727 m)  Wt 179 lb 3 oz (81.279 kg)  BMI 27.25 kg/m2  SpO2 97% Physical Exam  Constitutional: He is oriented to person, place, and time. He appears  well-developed and well-nourished. No distress.  HENT:  Head: Normocephalic and atraumatic.  Mouth/Throat: Oropharynx is clear and moist.  Eyes: Conjunctivae and EOM are normal. Pupils are equal, round, and reactive to light.  Neck: Normal range of motion. Neck supple.  Cardiovascular: Normal rate, regular rhythm and normal heart sounds.   Pulmonary/Chest: Effort normal and breath sounds normal.  Musculoskeletal: Normal range of motion.  Neurological: He is alert and oriented to person, place, and time.  Skin: Skin is warm and dry. Rash noted. Rash is papular. There is erythema.  Erythematous papular rash in a linear pattern to the hands, fingers; dorsal aspect of the wrists and forearm, elbows, and lateral portions of the abdomen.   Psychiatric: He has a normal mood and affect. His behavior is normal.  Nursing note and vitals reviewed.   ED Course  Procedures (including critical care time) DIAGNOSTIC STUDIES: Oxygen Saturation is 97% on RA, normal by my interpretation.    COORDINATION OF CARE: 4:54 PM-Discussed treatment plan which includes topical cream and steroid cream with pt at bedside and pt agreed to plan.    Labs Review Labs Reviewed - No data to display  Imaging Review No results found. I have personally reviewed and evaluated these images and lab results as part of my medical decision-making.   EKG Interpretation None      MDM   Patient presents today with a  rash most consistent with Poison Ivy.  No signs of infection.  Patient given Rx for Hydrocortisone and Prednisone.  Stable for discharge.  Return precautions given.     Final diagnoses:  None    I personally performed the services described in this documentation, which was scribed in my presence. The recorded information has been reviewed and is accurate.    Jared Glad, PA-C 12/21/15 2329  Jared Porter, MD 12/30/15 [phone]

## 2015-12-21 NOTE — Discharge Instructions (Signed)
Use Benadryl for itching °

## 2016-10-02 ENCOUNTER — Emergency Department (HOSPITAL_COMMUNITY): Payer: Self-pay

## 2016-10-02 ENCOUNTER — Encounter (HOSPITAL_COMMUNITY): Payer: Self-pay | Admitting: Emergency Medicine

## 2016-10-02 ENCOUNTER — Emergency Department (HOSPITAL_COMMUNITY)
Admission: EM | Admit: 2016-10-02 | Discharge: 2016-10-02 | Disposition: A | Discharge status: Home / Self Care | Payer: Self-pay | Attending: Emergency Medicine | Admitting: Emergency Medicine

## 2016-10-02 DIAGNOSIS — J0110 Acute frontal sinusitis, unspecified: Secondary | ICD-10-CM | POA: Insufficient documentation

## 2016-10-02 DIAGNOSIS — Z87891 Personal history of nicotine dependence: Secondary | ICD-10-CM | POA: Insufficient documentation

## 2016-10-02 MED ORDER — DOXYCYCLINE HYCLATE 100 MG PO CAPS: 100.0000 mg | ORAL_CAPSULE | Freq: Two times a day (BID) | ORAL | 0 refills | Status: DC

## 2016-10-02 MED ORDER — FLUTICASONE PROPIONATE 50 MCG/ACT NA SUSP: 1.0000 | Freq: Every day | NASAL | 0 refills | Status: DC

## 2016-10-02 MED ORDER — DEXAMETHASONE SODIUM PHOSPHATE 10 MG/ML IJ SOLN
10.0000 mg | Freq: Once | INTRAMUSCULAR | Status: AC
  Administered 2016-10-02: 10 mg via INTRAMUSCULAR
  Filled 2016-10-02: qty 1

## 2016-10-02 NOTE — Discharge Instructions (Signed)
Please read and follow all provided instructions.  Your diagnoses today include:  1. Acute non-recurrent frontal sinusitis     Tests performed today include: Vital signs. See below for your results today.   Medications prescribed:  Take as prescribed   Home care instructions:  Follow any educational materials contained in this packet.  Follow-up instructions: Please follow-up with your primary care provider for further evaluation of symptoms and treatment   Return instructions:  Please return to the Emergency Department if you do not get better, if you get worse, or new symptoms OR  - Fever (temperature greater than 101.19F)  - Bleeding that does not stop with holding pressure to the area    -Severe pain (please note that you may be more sore the day after your accident)  - Chest Pain  - Difficulty breathing  - Severe nausea or vomiting  - Inability to tolerate food and liquids  - Passing out  - Skin becoming red around your wounds  - Change in mental status (confusion or lethargy)  - New numbness or weakness    Please return if you have any other emergent concerns.  Additional Information:  Your vital signs today were: BP (!) 148/71 (BP Location: Right Arm)    Pulse 76    Temp 98.5 F (36.9 C) (Oral)    Resp 17    SpO2 98%  If your blood pressure (BP) was elevated above 135/85 this visit, please have this repeated by your doctor within one month. ---------------

## 2016-10-02 NOTE — ED Provider Notes (Signed)
[facility]-EMERGENCY DEPT Provider Note   CSN: [id number] Arrival date & time: 10/02/16  [id number]     History   Chief Complaint Chief Complaint  Patient presents with  . Cough    HPI Jared Lane is a 31 y.o. male.  HPI  31 y.o. male , presents to the Emergency Department today complaining of cough x 5 days. Notes rhinorrhea/congestion x 1 month. No fevers. No N/V/D. No CP/SOB/ABD pain. No sick contacts. OTC meds without relief. Notes generalized pain and rates 5/10. No other symptoms noted.   Past Medical History:  Diagnosis Date  . Chronic headaches   . Epistaxis     There are no active problems to display for this patient.   History reviewed. No pertinent surgical history.     Home Medications    Prior to Admission medications   Medication Sig Start Date End Date Taking? Authorizing Provider  hydrocortisone 2.5 % lotion Apply topically 2 (two) times daily. 12/21/15   Heather Laisure, PA-C  Phenyleph-CPM-DM-APAP (ALKA-SELTZER PLUS COLD & COUGH) 11-17-08-325 MG CAPS Take 1 tablet by mouth as needed (cold).    Historical Provider, MD  predniSONE (DELTASONE) 20 MG tablet Take 3 tablets (60 mg total) by mouth daily. 12/21/15   Santiago GladHeather Laisure, PA-C    Family History No family history on file.  Social History Social History  Substance Use Topics  . Smoking status: Former Games developermoker  . Smokeless tobacco: Never Used  . Alcohol use Yes     Comment: occasional     Allergies   Patient has no known allergies.   Review of Systems Review of Systems  Constitutional: Negative for fever.  HENT: Positive for congestion and rhinorrhea. Negative for sore throat.   Gastrointestinal: Negative for nausea and vomiting.   Physical Exam Updated Vital Signs BP (!) 148/71 (BP Location: Right Arm)   Pulse 76   Temp 98.5 F (36.9 C) (Oral)   Resp 17   SpO2 98%   Physical Exam  Constitutional: He is oriented to person, place, and time. He appears well-developed and well-nourished.  No distress.  HENT:  Head: Normocephalic and atraumatic.  Right Ear: Tympanic membrane, external ear and ear canal normal.  Left Ear: Tympanic membrane, external ear and ear canal normal.  Nose: Nose normal.  Mouth/Throat: Uvula is midline, oropharynx is clear and moist and mucous membranes are normal. No trismus in the jaw. No oropharyngeal exudate, posterior oropharyngeal erythema or tonsillar abscesses.  Eyes: EOM are normal. Pupils are equal, round, and reactive to light.  Neck: Normal range of motion. Neck supple. No tracheal deviation present.  Cardiovascular: Normal rate, regular rhythm, S1 normal, S2 normal, normal heart sounds, intact distal pulses and normal pulses.   Pulmonary/Chest: Effort normal and breath sounds normal. No respiratory distress. He has no decreased breath sounds. He has no wheezes. He has no rhonchi. He has no rales.  Abdominal: Normal appearance and bowel sounds are normal. There is no tenderness.  Musculoskeletal: Normal range of motion.  Neurological: He is alert and oriented to person, place, and time.  Skin: Skin is warm and dry.  Psychiatric: He has a normal mood and affect. His speech is normal and behavior is normal. Thought content normal.   ED Treatments / Results  Labs (all labs ordered are listed, but only abnormal results are displayed) Labs Reviewed - No data to display  EKG  EKG Interpretation None      Radiology Dg Chest 2 View  Result Date: 10/02/2016 CLINICAL  DATA:  Productive cough and shortness of breath over the last 2 months. EXAM: CHEST  2 VIEW COMPARISON:  None. FINDINGS: Heart size is normal. Mediastinal shadows are normal. The lungs are clear. No bronchial thickening. No infiltrate, mass, effusion or collapse. Pulmonary vascularity is normal. No bony abnormality. IMPRESSION: Normal chest Electronically Signed   By: Paulina Fusi M.D.   On: 10/02/2016 10:27    Procedures Procedures (including critical care time)  Medications  Ordered in ED Medications  dexamethasone (DECADRON) injection 10 mg (not administered)     Initial Impression / Assessment and Plan / ED Course  I have reviewed the triage vital signs and the nursing notes.  Pertinent labs & imaging results that were available during my care of the patient were reviewed by me and considered in my medical decision making (see chart for details).  Final Clinical Impressions(s) / ED Diagnoses   {I have reviewed and evaluated the relevant imaging studies.  {I have reviewed the relevant previous healthcare records.  {I obtained HPI from historian.   ED Course:  Assessment: Pt is a 31 y.o. male presents with URI symptoms x 1 month as well as cough x 5 days. No fever. No N/V/D . On exam, pt in NAD. VSS. Afebrile. Lungs CTA, Heart RRR. Abdomen nontender/soft. Pt CXR negative for acute infiltrate. Patients symptoms are consistent with URI, likely viral etiology. Discussed that antibiotics are not indicated for viral infections. Given Rx Flonase as well as Doxycycline if no improvement after 1 week. Pt will be discharged with symptomatic treatment.  Verbalizes understanding and is agreeable with plan. Pt is hemodynamically stable & in NAD prior to dc  Disposition/Plan:  DC Home Additional Verbal discharge instructions given and discussed with patient.  Pt Instructed to f/u with PCP in the next week for evaluation and treatment of symptoms. Return precautions given Pt acknowledges and agrees with plan  Supervising Physician Mancel Bale, MD  Final diagnoses:  Acute non-recurrent frontal sinusitis    New Prescriptions New Prescriptions   No medications on file     Audry Pili, PA-C 10/02/16 1049    Mancel Bale, MD 10/02/16 1554

## 2016-10-02 NOTE — ED Triage Notes (Signed)
Patient in with complaints of cough for 5 days. Reports coughing up yellow sputum. Loss of appetite. Chills, body aches. Denies fever.

## 2016-11-29 ENCOUNTER — Encounter (HOSPITAL_BASED_OUTPATIENT_CLINIC_OR_DEPARTMENT_OTHER): Payer: Self-pay | Admitting: *Deleted

## 2016-11-29 ENCOUNTER — Emergency Department (HOSPITAL_BASED_OUTPATIENT_CLINIC_OR_DEPARTMENT_OTHER): Payer: Worker's Compensation

## 2016-11-29 ENCOUNTER — Emergency Department (HOSPITAL_BASED_OUTPATIENT_CLINIC_OR_DEPARTMENT_OTHER)
Admission: EM | Admit: 2016-11-29 | Discharge: 2016-11-29 | Disposition: A | Discharge status: Home / Self Care | Payer: Worker's Compensation | Attending: Emergency Medicine | Admitting: Emergency Medicine

## 2016-11-29 DIAGNOSIS — Y990 Civilian activity done for income or pay: Secondary | ICD-10-CM | POA: Insufficient documentation

## 2016-11-29 DIAGNOSIS — S62663B Nondisplaced fracture of distal phalanx of left middle finger, initial encounter for open fracture: Secondary | ICD-10-CM | POA: Diagnosis not present

## 2016-11-29 DIAGNOSIS — W231XXA Caught, crushed, jammed, or pinched between stationary objects, initial encounter: Secondary | ICD-10-CM | POA: Insufficient documentation

## 2016-11-29 DIAGNOSIS — Z87891 Personal history of nicotine dependence: Secondary | ICD-10-CM | POA: Diagnosis not present

## 2016-11-29 DIAGNOSIS — Y9389 Activity, other specified: Secondary | ICD-10-CM | POA: Insufficient documentation

## 2016-11-29 DIAGNOSIS — S6992XA Unspecified injury of left wrist, hand and finger(s), initial encounter: Secondary | ICD-10-CM | POA: Diagnosis present

## 2016-11-29 DIAGNOSIS — Y929 Unspecified place or not applicable: Secondary | ICD-10-CM | POA: Insufficient documentation

## 2016-11-29 MED ORDER — MORPHINE SULFATE (PF) 4 MG/ML IV SOLN
4.0000 mg | Freq: Once | INTRAVENOUS | Status: AC
  Administered 2016-11-29: 4 mg via INTRAVENOUS
  Filled 2016-11-29: qty 1

## 2016-11-29 MED ORDER — DEXAMETHASONE SODIUM PHOSPHATE 10 MG/ML IJ SOLN
10.0000 mg | Freq: Once | INTRAMUSCULAR | Status: DC
  Filled 2016-11-29: qty 1

## 2016-11-29 MED ORDER — ONDANSETRON HCL 4 MG/2ML IJ SOLN
4.0000 mg | Freq: Once | INTRAMUSCULAR | Status: AC
  Administered 2016-11-29: 4 mg via INTRAVENOUS
  Filled 2016-11-29: qty 2

## 2016-11-29 MED ORDER — CEFAZOLIN SODIUM-DEXTROSE 1-4 GM/50ML-% IV SOLN
1.0000 g | Freq: Once | INTRAVENOUS | Status: AC
  Administered 2016-11-29: 1 g via INTRAVENOUS
  Filled 2016-11-29: qty 50

## 2016-11-29 NOTE — ED Provider Notes (Signed)
[facility]   CSN: [id number] Arrival date & time: 11/29/16  4098     History   Chief Complaint Chief Complaint  Patient presents with  . Finger Injury    HPI Jared Lane is a 31 y.o. male.  Patient is a 31 year old male with no significant past medical history. He presents for evaluation of a left middle finger injury. He reports lowering a heavy piece of metal down on his finger. This caused the nail to be a full as well as the finger pad. He denies any alleviating factors. Pain is worse with movement. Last tetanus shot was 3 years ago.   The history is provided by the patient.    Past Medical History:  Diagnosis Date  . Chronic headaches   . Epistaxis     There are no active problems to display for this patient.   History reviewed. No pertinent surgical history.     Home Medications    Prior to Admission medications   Medication Sig Start Date End Date Taking? Authorizing Provider  Multiple Vitamin (MULTIVITAMIN) tablet Take 1 tablet by mouth daily.   Yes [provider]  doxycycline (VIBRAMYCIN) 100 MG capsule Take 1 capsule (100 mg total) by mouth 2 (two) times daily. 10/02/16   Audry Pili, PA-C  fluticasone (FLONASE) 50 MCG/ACT nasal spray Place 1 spray into both nostrils daily. 10/02/16   Audry Pili, PA-C  hydrocortisone 2.5 % lotion Apply topically 2 (two) times daily. 12/21/15   Santiago Glad, PA-C  Phenyleph-CPM-DM-APAP (ALKA-SELTZER PLUS COLD & COUGH) 11-17-08-325 MG CAPS Take 1 tablet by mouth as needed (cold).    [provider]  predniSONE (DELTASONE) 20 MG tablet Take 3 tablets (60 mg total) by mouth daily. 12/21/15   Santiago Glad, PA-C    Family History No family history on file.  Social History Social History  Substance Use Topics  . Smoking status: Former Games developer  . Smokeless tobacco: Never Used  . Alcohol use Yes     Comment: occasional     Allergies   Patient has no known  allergies.   Review of Systems Review of Systems  All other systems reviewed and are negative.    Physical Exam Updated Vital Signs BP (!) 134/98 (BP Location: Right Arm)   Pulse 70   Temp 98.3 F (36.8 C) (Oral)   Resp 18   Ht 5\' 8"  (1.727 m)   Wt 175 lb (79.4 kg)   SpO2 100%   BMI 26.61 kg/m   Physical Exam  Constitutional: He is oriented to person, place, and time. He appears well-developed and well-nourished. No distress.  HENT:  Head: Normocephalic and atraumatic.  Neck: Normal range of motion. Neck supple.  Musculoskeletal: Normal range of motion.  The left middle finger appears to have an open fracture of the distal phalanx with loss of the nail.  Neurological: He is alert and oriented to person, place, and time.  Skin: Skin is warm and dry. He is not diaphoretic.  Nursing note and vitals reviewed.    ED Treatments / Results  Labs (all labs ordered are listed, but only abnormal results are displayed) Labs Reviewed - No data to display  EKG  EKG Interpretation None       Radiology No results found.  Procedures Procedures (including critical care time)  Medications Ordered in ED Medications  morphine 4 MG/ML injection 4 mg (not administered)  ceFAZolin (ANCEF) IVPB 1 g/50 mL premix (not administered)  Initial Impression / Assessment and Plan / ED Course  I have reviewed the triage vital signs and the nursing notes.  Pertinent labs & imaging results that were available during my care of the patient were reviewed by me and considered in my medical decision making (see chart for details).  Patient presents after a crush injury to the left middle finger. He has an open fracture with missing fingertip and missing fingernail. He was given, pain medicine. His tetanus shot was up-to-date. I've spoken with Dr. Izora Ribasoley from hand surgery who would like the patient transferred to his office where he can make arrangements for surgical repair.  Final  Clinical Impressions(s) / ED Diagnoses   Final diagnoses:  None    New Prescriptions New Prescriptions   No medications on file     Geoffery Lyonselo, [place] Current, MD 11/29/16 1117

## 2016-11-29 NOTE — ED Triage Notes (Addendum)
Pt reports around 0800 at work a piece of metal on a machine came down and crushed his L middle finger. States his nail was pulled off (pt currently has a glove on but states his finger's intact. Per pt's supervisor, a drug screen is needed.   Addendum: Drug screen not required per supervisor, Josh Stuckenschnerder.

## 2016-11-29 NOTE — Discharge Instructions (Signed)
Go directly to Dr. Debby Budoley's office for him to make arrangements to repair your finger.  Do not eat or drink anything between here and there.

## 2017-05-23 IMAGING — DX DG FINGER MIDDLE 2+V*L*
3 series · 3 of 3 positions shown · non-contrast
Comparison: None in PACs

CLINICAL DATA: Injury of the left middle movements finger at work
this morning with avulsion of the tip of the finger. The patient
reports bleeding and tenderness.

EXAM:
LEFT MIDDLE FINGER 2+V

[finger ap]
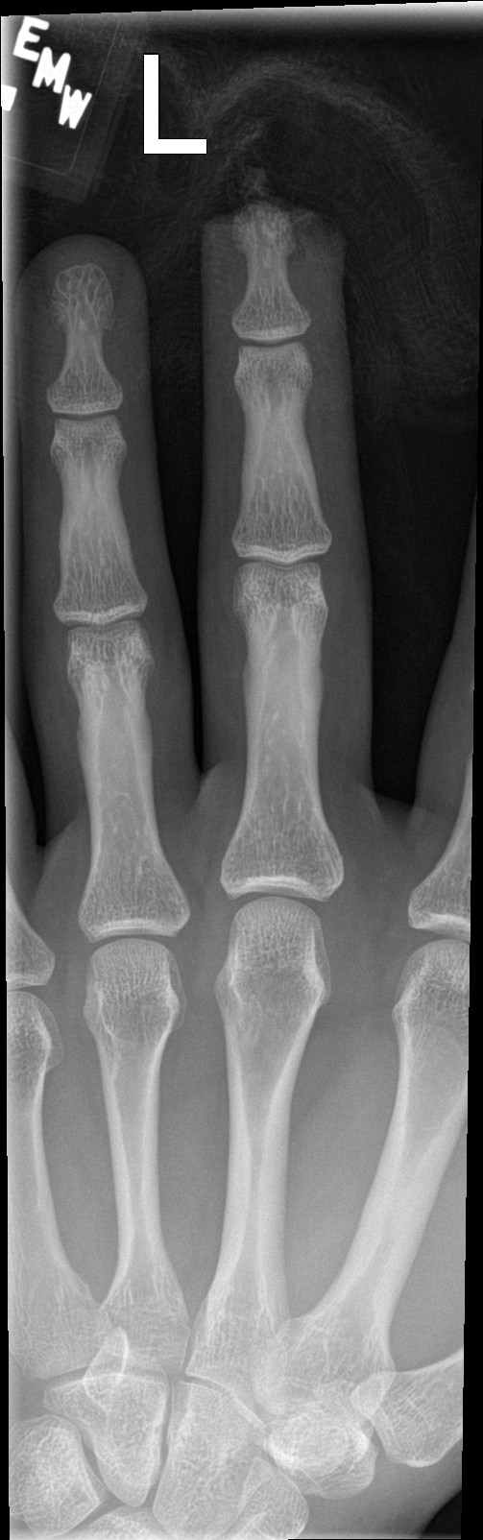

[finger obl]
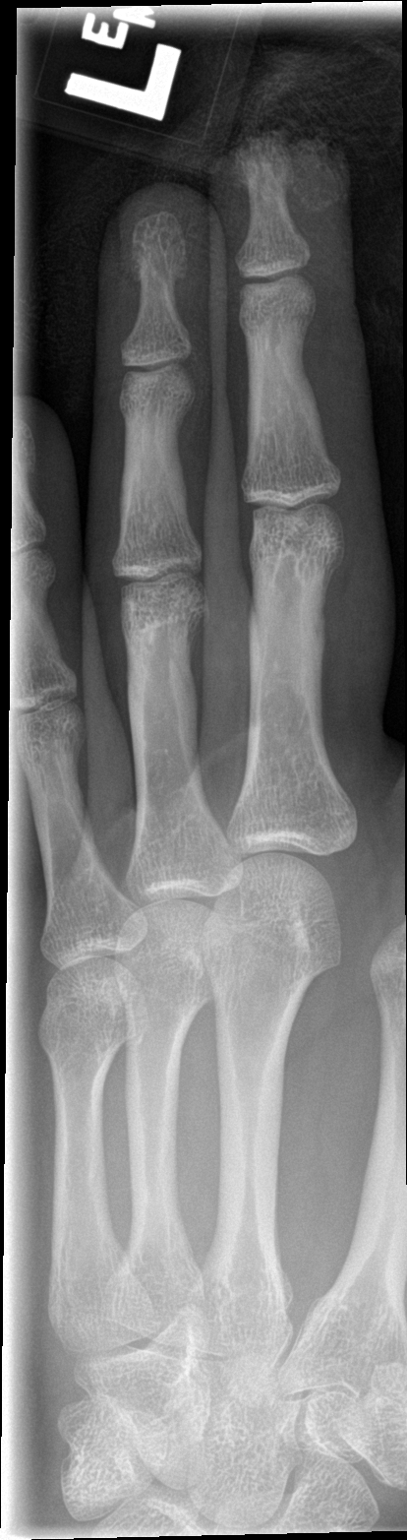

[finger lat]
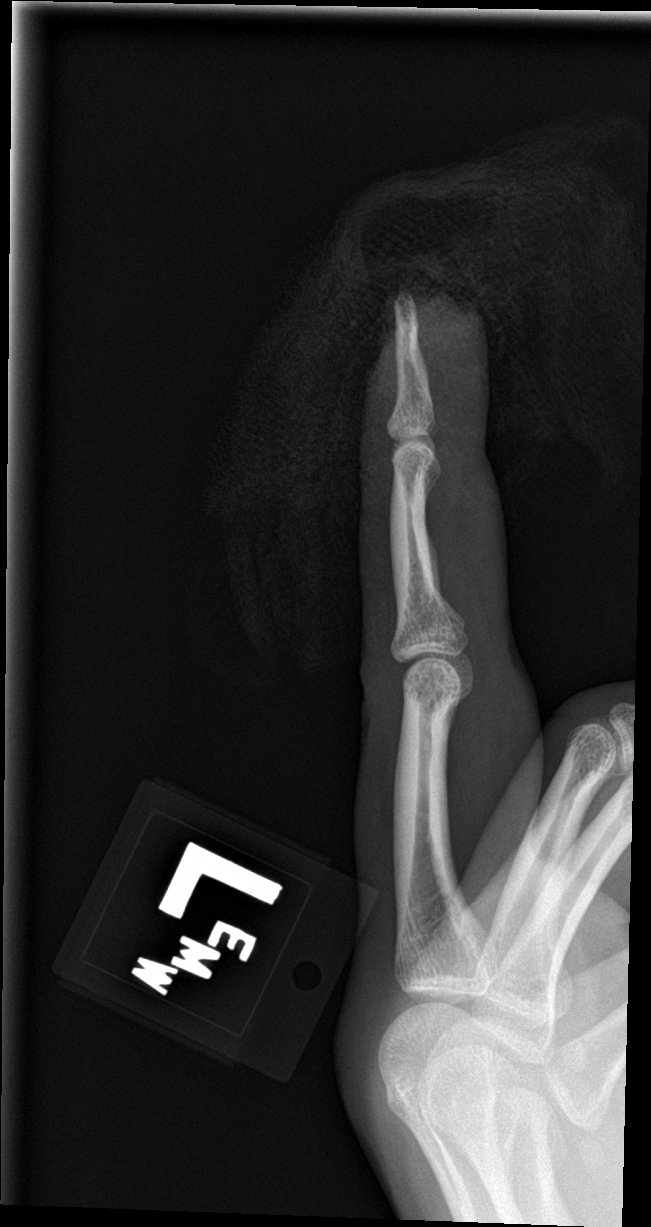

[3 of 3 positions shown; findings below may reference images not displayed]

FINDINGS: There has been avulsion of the soft tissues of the tip of the third
finger. The tuft of the distal phalanx is mildly irregular
consistent with small avulsion. The IP joints are normal. The
proximal and middle phalanges are normal.
IMPRESSION: Open avulsion fracture of the tuft of the distal phalanx of the left
third finger with avulsion of the soft tissues of the tip of the
finger including the nail.

## 2017-12-19 ENCOUNTER — Emergency Department (HOSPITAL_COMMUNITY)
Admission: EM | Admit: 2017-12-19 | Discharge: 2017-12-19 | Disposition: A | Discharge status: Home / Self Care | Payer: Self-pay | Attending: Emergency Medicine | Admitting: Emergency Medicine

## 2017-12-19 ENCOUNTER — Encounter (HOSPITAL_COMMUNITY): Payer: Self-pay | Admitting: Emergency Medicine

## 2017-12-19 DIAGNOSIS — W57XXXA Bitten or stung by nonvenomous insect and other nonvenomous arthropods, initial encounter: Secondary | ICD-10-CM | POA: Insufficient documentation

## 2017-12-19 DIAGNOSIS — Y93H2 Activity, gardening and landscaping: Secondary | ICD-10-CM | POA: Insufficient documentation

## 2017-12-19 DIAGNOSIS — Y999 Unspecified external cause status: Secondary | ICD-10-CM | POA: Insufficient documentation

## 2017-12-19 DIAGNOSIS — L089 Local infection of the skin and subcutaneous tissue, unspecified: Secondary | ICD-10-CM

## 2017-12-19 DIAGNOSIS — S70362A Insect bite (nonvenomous), left thigh, initial encounter: Secondary | ICD-10-CM | POA: Insufficient documentation

## 2017-12-19 DIAGNOSIS — Y92007 Garden or yard of unspecified non-institutional (private) residence as the place of occurrence of the external cause: Secondary | ICD-10-CM | POA: Insufficient documentation

## 2017-12-19 DIAGNOSIS — Z87891 Personal history of nicotine dependence: Secondary | ICD-10-CM | POA: Insufficient documentation

## 2017-12-19 DIAGNOSIS — L03116 Cellulitis of left lower limb: Secondary | ICD-10-CM | POA: Insufficient documentation

## 2017-12-19 MED ORDER — DOXYCYCLINE HYCLATE 100 MG PO TABS
100.0000 mg | ORAL_TABLET | Freq: Once | ORAL | Status: AC
  Administered 2017-12-19: 100 mg via ORAL
  Filled 2017-12-19: qty 1

## 2017-12-19 MED ORDER — BACITRACIN ZINC 500 UNIT/GM EX OINT
TOPICAL_OINTMENT | CUTANEOUS | Status: AC
  Administered 2017-12-19: 1
  Filled 2017-12-19: qty 0.9

## 2017-12-19 MED ORDER — DOXYCYCLINE HYCLATE 100 MG PO CAPS: 100.0000 mg | ORAL_CAPSULE | Freq: Two times a day (BID) | ORAL | 0 refills | Status: DC

## 2017-12-19 NOTE — ED Provider Notes (Signed)
[facility]-EMERGENCY DEPT Provider Note   CSN: [id number] Arrival date & time: 12/19/17  1401     History   Chief Complaint Chief Complaint  Patient presents with  . Tick Removal    HPI Jared Lane is a 32 y.o. male who presents to the emergency department with a chief complaint of multiple tick bites.  Patient reports that he was outside mowing the lawn 2 days ago.  When he came inside, he had multiple ticks attached to his bilateral legs.  He removed the ticks immediately.  He reports worsening pain and swelling to the left inner thigh over the last 2 days.  The wound has been draining a small amount of yellowish drainage since this morning.  He denies fever, chills, rash, or headache.  Treatment prior to arrival includes placing a gauze bandage over the wound of the left thigh to decrease friction against his pants.  He is a former smoker.  No history of diabetes or HIV.  No known allergies to medications.  The history is provided by the patient. No language interpreter was used.    Past Medical History:  Diagnosis Date  . Chronic headaches   . Epistaxis     There are no active problems to display for this patient.   Past Surgical History:  Procedure Laterality Date  . FINGER SURGERY          Home Medications    Prior to Admission medications   Medication Sig Start Date End Date Taking? Authorizing Provider  Multiple Vitamin (MULTIVITAMIN) tablet Take 1 tablet by mouth daily.   Yes [provider]  doxycycline (VIBRAMYCIN) 100 MG capsule Take 1 capsule (100 mg total) by mouth 2 (two) times daily. 12/19/17   Brantley Wiley A, PA-C    Family History No family history on file.  Social History Social History   Tobacco Use  . Smoking status: Former Games developer  . Smokeless tobacco: Never Used  Substance Use Topics  . Alcohol use: Yes    Comment: occasional  . Drug use: No     Allergies   Patient has no known  allergies.   Review of Systems Review of Systems  Constitutional: Negative for activity change, chills and fever.  Respiratory: Negative for shortness of breath.   Cardiovascular: Negative for chest pain.  Gastrointestinal: Negative for abdominal pain.  Musculoskeletal: Positive for myalgias. Negative for back pain.  Skin: Positive for color change and wound. Negative for pallor and rash.  Neurological: Negative for weakness and numbness.     Physical Exam Updated Vital Signs BP 118/80   Pulse 65   Temp 98.9 F (37.2 C) (Oral)   Resp 16   Ht 5\' 7"  (1.702 m)   Wt 79.5 kg (175 lb 5 oz)   SpO2 95%   BMI 27.46 kg/m   Physical Exam  Constitutional: He appears well-developed.  HENT:  Head: Normocephalic.  Eyes: Conjunctivae are normal.  Neck: Neck supple.  Cardiovascular: Normal rate and regular rhythm.  No murmur heard. Pulmonary/Chest: Effort normal.  Abdominal: Soft. He exhibits no distension.  Musculoskeletal: He exhibits tenderness. He exhibits no deformity.  Large erythematous area that is warm to the touch to the left medial thigh.  There is a central puncture mark that is draining serous sanguinous fluid.  No purulent drainage.  There is approximately a 0.5 cm radius of induration around to the puncture mark.  No fluctuance.  No red streaking.  There is a puncture mark  to the right posterior thigh with a small amount of erythema surrounding the area, but no warmth or edema.  2 puncture marks with central eschars and minimal surrounding redness are noted to the left medial ankle and right medial ankle.  No surrounding warmth or edema.  See pictures below.  Neurological: He is alert.  Skin: Skin is warm and dry.  Psychiatric: His behavior is normal.  Nursing note and vitals reviewed.  Left medial thigh  Right posterior thigh   Left and right medial ankles      ED Treatments / Results  Labs (all labs ordered are listed, but only abnormal results are  displayed) Labs Reviewed - No data to display  EKG None  Radiology No results found.  Procedures Procedures (including critical care time)  Medications Ordered in ED Medications  doxycycline (VIBRA-TABS) tablet 100 mg (100 mg Oral Given 12/19/17 1647)  bacitracin 500 UNIT/GM ointment (1 application  Given 12/19/17 1649)     Initial Impression / Assessment and Plan / ED Course  I have reviewed the triage vital signs and the nursing notes.  Pertinent labs & imaging results that were available during my care of the patient were reviewed by me and considered in my medical decision making (see chart for details).     32 year old male with no pertinent past medical history presenting to the emergency department for evaluation after he removed multiple ticks from his bilateral lower extremities 2 days ago.  The ticks were likely attached to the skin for less than 48 hours as they were not present before he went out to mow the lawn.  No petechial or bull's-eye rash, fever, or headache.  Doubt [facility] fever or Lyme's disease.  He has cellulitis to the left medial thigh.  No fluctuance or purulent drainage to suggest abscess.  Given wound was caused by tick bite, will treat cellulitis with doxycycline to also cover for tickborne disease.  The area of erythema was outlined with a skin marker in the ED.  First dose of doxycycline given here.  Wound care instructions provided for home.  He was also given strict return precautions to the emergency department.  No other factors to impair wound healing.  Strict return precautions given.  He is hemodynamically stable in no acute distress.  He is safe for discharge home at this time.  Final Clinical Impressions(s) / ED Diagnoses   Final diagnoses:  Tick bite of left thigh with infection, initial encounter  Cellulitis of left lower extremity    ED Discharge Orders        Ordered    doxycycline (VIBRAMYCIN) 100 MG capsule  2 times daily      12/19/17 1646       Suhey Radford A, PA-C 12/19/17 1657    Raeford RazorKohut, Stephen, MD 12/21/17 1445

## 2017-12-19 NOTE — Discharge Instructions (Signed)
Thank you for allowing me to provide your care today in the emergency department.  Your exam was consistent with cellulitis, which is a bacterial skin infection, of your left thigh.  The treatment for this is antibiotics.  Your first dose of doxycycline, and antibiotic, was given today in the emergency department.  Starting tonight, take 1 tablet of doxycycline twice daily for the next 7 days.  Take 600 mg of ibuprofen with food or 650 mg of Tylenol every 6 hours for pain control.  You can also apply ice or heat to the area for 15 to 20 minutes up to 3-4 times a day.  Heat or warm compresses will help to bring infection to the surface.  Ice can be more effective for pain and swelling.  Keep the area clean by washing the area at least once daily with warm water and soap.  Then, apply a topical antibiotic such as bacitracin or Neosporin to the area.  These are available over-the-counter.  When you are at work, you can cover the area with a gauze dressing since there is a little bit of drainage coming from the bite mark.  When you are home, you will have to keep a dressing over the wound unless it is actively draining.  Avoid hot tubs, pools, or submerging the leg and water for long period of time until the wound completely heals.  The area of redness has been marked today in the emergency department.  Redness should not extend beyond this line after you have been taking antibiotics for 48 to 72 hours.  Return to the emergency department if you develop fever, chills, if the area of redness extends beyond the marked at 48 to 72 hours after you have been started on antibiotics, if the wound starts to drain thick, mucus-like drainage or pus.

## 2017-12-19 NOTE — ED Triage Notes (Signed)
Pt removed tick from left upper thigh over the weekend. Has red are at site with yellowish drainage.

## 2022-11-06 ENCOUNTER — Encounter (HOSPITAL_COMMUNITY): Payer: Self-pay

## 2022-11-06 ENCOUNTER — Emergency Department (HOSPITAL_COMMUNITY)
Admission: EM | Admit: 2022-11-06 | Discharge: 2022-11-06 | Disposition: A | Discharge status: Home / Self Care | Payer: Self-pay | Attending: Emergency Medicine | Admitting: Emergency Medicine

## 2022-11-06 ENCOUNTER — Other Ambulatory Visit: Payer: Self-pay

## 2022-11-06 DIAGNOSIS — K047 Periapical abscess without sinus: Secondary | ICD-10-CM

## 2022-11-06 MED ORDER — AMOXICILLIN-POT CLAVULANATE 875-125 MG PO TABS
1.0000 | ORAL_TABLET | Freq: Once | ORAL | Status: AC
  Administered 2022-11-06: 1 via ORAL
  Filled 2022-11-06: qty 1

## 2022-11-06 MED ORDER — IBUPROFEN 800 MG PO TABS
800.0000 mg | ORAL_TABLET | Freq: Once | ORAL | Status: AC
  Administered 2022-11-06: 800 mg via ORAL
  Filled 2022-11-06: qty 1

## 2022-11-06 MED ORDER — AMOXICILLIN-POT CLAVULANATE 875-125 MG PO TABS: 1.0000 | ORAL_TABLET | Freq: Two times a day (BID) | ORAL | 0 refills | Status: AC

## 2022-11-06 NOTE — Discharge Instructions (Addendum)
Thank you for allowing me to be a part of your care today.   You have evidence of a dental abscess/infection which will need to be treated with antibiotics.  You were given your first dose of medicine while in the ER.  I have sent a prescription to your pharmacy to begin taking tomorrow.  Take for the entire 7 days, even if your symptoms begin to improve.   Please schedule a follow-up appointment with a dentist as soon as possible.    I recommend taking ibuprofen 800 mg every 6-8 hours as needed for pain and swelling.  You may alternate this every 3-4 hours with 1000 mg of Tylenol if needed.    Return to the ED if you have sudden worsening of your symptoms, develop difficulty breathing or swallowing, or if you have any new concerns.

## 2022-11-06 NOTE — ED Provider Notes (Signed)
[place] EMERGENCY DEPARTMENT AT [facility] Provider Note   CSN: [id number] Arrival date & time: 11/06/22  1623     History  No chief complaint on file.   Jared Lane is a 38 y.o. male.      Home Medications Prior to Admission medications   Medication Sig Start Date End Date Taking? Authorizing Provider  amoxicillin-clavulanate (AUGMENTIN) 875-125 MG tablet Take 1 tablet by mouth every 12 (twelve) hours. 11/06/22  Yes Demara Lover R, PA-C  Multiple Vitamin (MULTIVITAMIN) tablet Take 1 tablet by mouth daily.    [provider]      Allergies    Patient has no known allergies.    Review of Systems   Review of Systems  HENT:  Positive for dental problem and facial swelling (mild, left side).     Physical Exam Updated Vital Signs BP 121/77 (BP Location: Right Arm)   Pulse (!) 59   Temp 98.4 F (36.9 C) (Oral)   Resp 16   Ht  (1.727 m)   Wt 81.6 kg   SpO2 98%   BMI 27.37 kg/m  Physical Exam Vitals and nursing note reviewed.  Constitutional:      General: He is not in acute distress.    Appearance: Normal appearance. He is not ill-appearing or diaphoretic.  HENT:     Mouth/Throat:     Lips: Pink.     Mouth: Mucous membranes are moist.     Dentition: Dental tenderness, gingival swelling and dental abscesses present. No dental caries or gum lesions.     Pharynx: Oropharynx is clear. Uvula midline.   Cardiovascular:     Rate and Rhythm: Normal rate and regular rhythm.  Pulmonary:     Effort: Pulmonary effort is normal.  Neurological:     Mental Status: He is alert. Mental status is at baseline.  Psychiatric:        Mood and Affect: Mood normal.        Behavior: Behavior normal.     ED Results / Procedures / Treatments   Labs (all labs ordered are listed, but only abnormal results are displayed) Labs Reviewed - No data to display  EKG None  Radiology No results found.  Procedures Procedures    Medications  Ordered in ED Medications  amoxicillin-clavulanate (AUGMENTIN) 875-125 MG per tablet 1 tablet (1 tablet Oral Given 11/06/22 2126)  ibuprofen (ADVIL) tablet 800 mg (800 mg Oral Given 11/06/22 2126)    ED Course/ Medical Decision Making/ A&P                             Medical Decision Making Risk Prescription drug management.   This patient presents to the ED with chief complaint(s) of dental pain and facial swelling.  The differential diagnosis includes dental abscess, gingivitis, periodontitis, dental caries.  This list is not exhaustive.     Initial Assessment:   Exam significant for swelling and tenderness around tooth #14 and #15.  Patient has evidence of dental abscess.  No active bleeding or drainage.  Mild facial swelling in this same area.    Treatment and Reassessment: Patient given his first dose of Augmentin while in the ED.  Will also give ibuprofen for pain and swelling.   Disposition:   Patient has evidence of an acute dental abscess.  Augmentin prescription sent to patient's pharmacy.  Discussed supportive care measures for home including use of Tylenol and/or ibuprofen  as needed for pain and swelling.  Advised patient to schedule a follow-up appointment with a dentist.    The patient has been appropriately medically screened and/or stabilized in the ED. I have low suspicion for any other emergent medical condition which would require further screening, evaluation or treatment in the ED or require inpatient management. At time of discharge the patient is hemodynamically stable and in no acute distress. I have discussed work-up results and diagnosis with patient and answered all questions. Patient is agreeable with discharge plan. We discussed strict return precautions for returning to the emergency department and they verbalized understanding.           Final Clinical Impression(s) / ED Diagnoses Final diagnoses:  Dental abscess    Rx / DC Orders ED Discharge  Orders          Ordered    amoxicillin-clavulanate (AUGMENTIN) 875-125 MG tablet  Every 12 hours        11/06/22 2121              Lenard Simmer, PA-C 11/06/22 2129    Loetta Rough, MD 11/09/22 1255

## 2022-11-06 NOTE — ED Notes (Signed)
Patient verbalizes understanding of discharge instructions. Opportunity for questioning and answers were provided. Armband removed by staff, pt discharged from ED. Pt ambulatory to ED waiting room with steady gait.  

## 2022-11-06 NOTE — ED Triage Notes (Signed)
Pt came in via POV d/t top Lt tooth pain for the past 3 days, denies fever or drainage from the gums, A/Ox4.
